# Patient Record
Sex: Female | Born: 1975 | Race: White | Hispanic: No | Marital: Married | Smoking: Former smoker
Health system: Southern US, Community
[De-identification: ages and names within clinical notes are randomized; demographics above are authoritative.]

## PROBLEM LIST (undated history)

## (undated) HISTORY — PX: SHOULDER ARTHROSCOPY: SHX128

## (undated) HISTORY — PX: APPENDECTOMY: SHX54

## (undated) HISTORY — PX: CARDIAC ELECTROPHYSIOLOGY STUDY AND ABLATION: SHX1294

## (undated) HISTORY — PX: KNEE ARTHROPLASTY: SHX992

---

## 2010-10-12 ENCOUNTER — Ambulatory Visit: Payer: Self-pay | Admitting: Family Medicine

## 2010-12-16 ENCOUNTER — Ambulatory Visit: Payer: Self-pay | Admitting: Unknown Physician Specialty

## 2010-12-23 ENCOUNTER — Ambulatory Visit: Payer: Self-pay | Admitting: Unknown Physician Specialty

## 2011-05-17 ENCOUNTER — Ambulatory Visit: Payer: Self-pay | Admitting: Obstetrics and Gynecology

## 2011-05-19 ENCOUNTER — Ambulatory Visit: Payer: Self-pay | Admitting: Obstetrics and Gynecology

## 2011-11-20 ENCOUNTER — Ambulatory Visit: Payer: Self-pay | Admitting: Surgery

## 2011-12-18 ENCOUNTER — Ambulatory Visit: Payer: Self-pay | Admitting: Surgery

## 2011-12-18 HISTORY — PX: BREAST BIOPSY: SHX20

## 2011-12-25 LAB — PATHOLOGY REPORT

## 2012-05-20 ENCOUNTER — Ambulatory Visit: Payer: Self-pay | Admitting: Surgery

## 2013-03-19 ENCOUNTER — Emergency Department: Payer: Self-pay | Admitting: Emergency Medicine

## 2014-08-13 ENCOUNTER — Ambulatory Visit: Payer: Self-pay | Admitting: Obstetrics and Gynecology

## 2017-01-22 ENCOUNTER — Other Ambulatory Visit: Payer: Self-pay | Admitting: Obstetrics & Gynecology

## 2017-01-22 DIAGNOSIS — N6310 Unspecified lump in the right breast, unspecified quadrant: Secondary | ICD-10-CM

## 2017-01-22 DIAGNOSIS — Z1239 Encounter for other screening for malignant neoplasm of breast: Secondary | ICD-10-CM

## 2017-01-26 ENCOUNTER — Ambulatory Visit
Admission: RE | Admit: 2017-01-26 | Discharge: 2017-01-26 | Disposition: A | Discharge status: Home / Self Care | Payer: BLUE CROSS/BLUE SHIELD | Source: Ambulatory Visit | Attending: Obstetrics & Gynecology | Admitting: Obstetrics & Gynecology

## 2017-01-26 ENCOUNTER — Encounter: Payer: Self-pay | Admitting: Radiology

## 2017-01-26 DIAGNOSIS — N6001 Solitary cyst of right breast: Secondary | ICD-10-CM | POA: Insufficient documentation

## 2017-01-26 DIAGNOSIS — N6310 Unspecified lump in the right breast, unspecified quadrant: Secondary | ICD-10-CM

## 2017-01-26 DIAGNOSIS — Z1239 Encounter for other screening for malignant neoplasm of breast: Secondary | ICD-10-CM

## 2017-01-26 DIAGNOSIS — N6320 Unspecified lump in the left breast, unspecified quadrant: Secondary | ICD-10-CM | POA: Insufficient documentation

## 2018-01-16 ENCOUNTER — Other Ambulatory Visit: Payer: Self-pay | Admitting: Orthopedic Surgery

## 2018-01-16 DIAGNOSIS — M2351 Chronic instability of knee, right knee: Secondary | ICD-10-CM

## 2018-01-16 DIAGNOSIS — M1711 Unilateral primary osteoarthritis, right knee: Secondary | ICD-10-CM

## 2018-01-16 DIAGNOSIS — M2391 Unspecified internal derangement of right knee: Secondary | ICD-10-CM

## 2018-01-16 DIAGNOSIS — M25561 Pain in right knee: Secondary | ICD-10-CM

## 2018-01-25 ENCOUNTER — Other Ambulatory Visit: Payer: Self-pay | Admitting: Obstetrics & Gynecology

## 2018-01-25 DIAGNOSIS — Z1231 Encounter for screening mammogram for malignant neoplasm of breast: Secondary | ICD-10-CM

## 2018-01-29 ENCOUNTER — Ambulatory Visit
Admission: RE | Admit: 2018-01-29 | Discharge: 2018-01-29 | Disposition: A | Discharge status: Home / Self Care | Payer: BLUE CROSS/BLUE SHIELD | Source: Ambulatory Visit | Attending: Orthopedic Surgery | Admitting: Orthopedic Surgery

## 2018-01-29 DIAGNOSIS — M1711 Unilateral primary osteoarthritis, right knee: Secondary | ICD-10-CM

## 2018-01-29 DIAGNOSIS — M25561 Pain in right knee: Secondary | ICD-10-CM

## 2018-01-29 DIAGNOSIS — M2351 Chronic instability of knee, right knee: Secondary | ICD-10-CM

## 2018-01-29 DIAGNOSIS — M2391 Unspecified internal derangement of right knee: Secondary | ICD-10-CM

## 2018-02-04 ENCOUNTER — Ambulatory Visit
Admission: RE | Admit: 2018-02-04 | Discharge: 2018-02-04 | Disposition: A | Discharge status: Home / Self Care | Payer: BC Managed Care – PPO | Source: Ambulatory Visit | Attending: Obstetrics & Gynecology | Admitting: Obstetrics & Gynecology

## 2018-02-04 DIAGNOSIS — Z1231 Encounter for screening mammogram for malignant neoplasm of breast: Secondary | ICD-10-CM | POA: Diagnosis present

## 2018-03-31 IMAGING — US US BREAST*R* LIMITED INC AXILLA
1 series · 12 of 12 positions shown · non-contrast
Comparison: Previous exam(s).

CLINICAL DATA: Focal pain in the nipple area of the right breast
recently, resolved today. She also feels a mass in the upper-outer
quadrant of the right breast.

EXAM:
2D DIGITAL DIAGNOSTIC BILATERAL MAMMOGRAM WITH CAD AND ADJUNCT TOMO
ULTRASOUND RIGHT BREAST

[Series 1: us breast*right* limited inc axilla · 0.09mm/px · 12 of 12 slices shown]
[im 1/12]
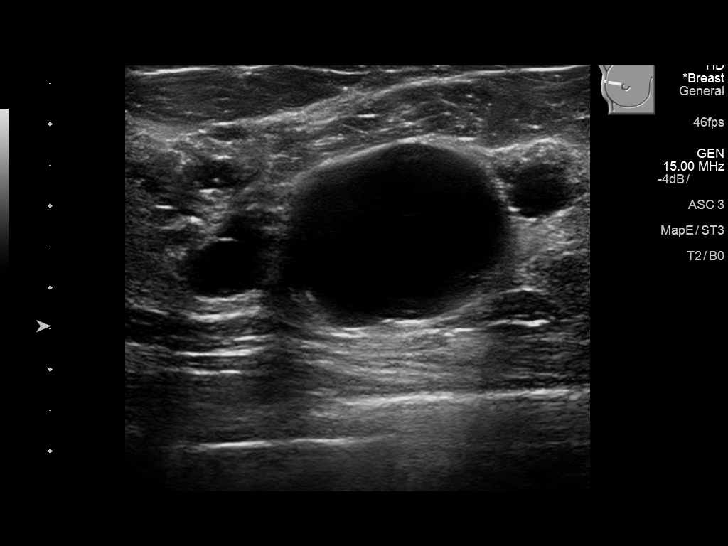
[im 2/12]
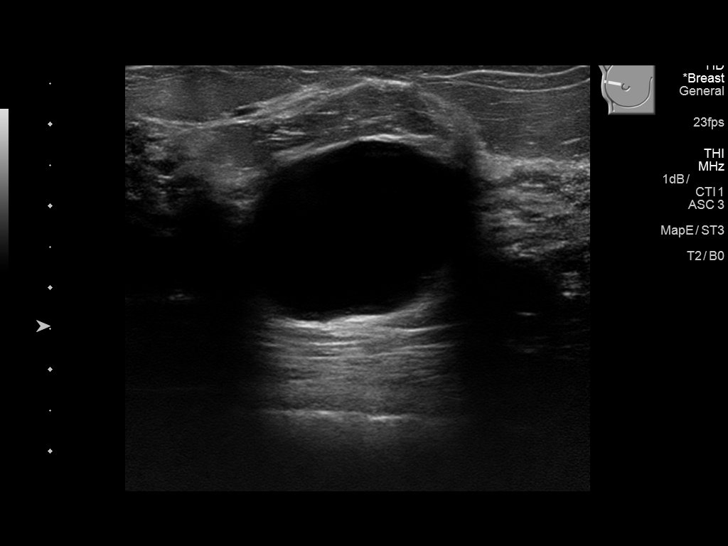
[im 3/12]
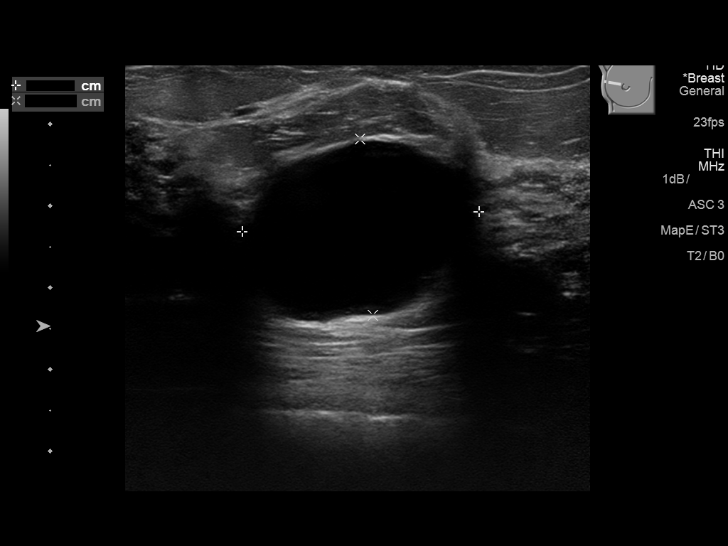
[im 4/12]
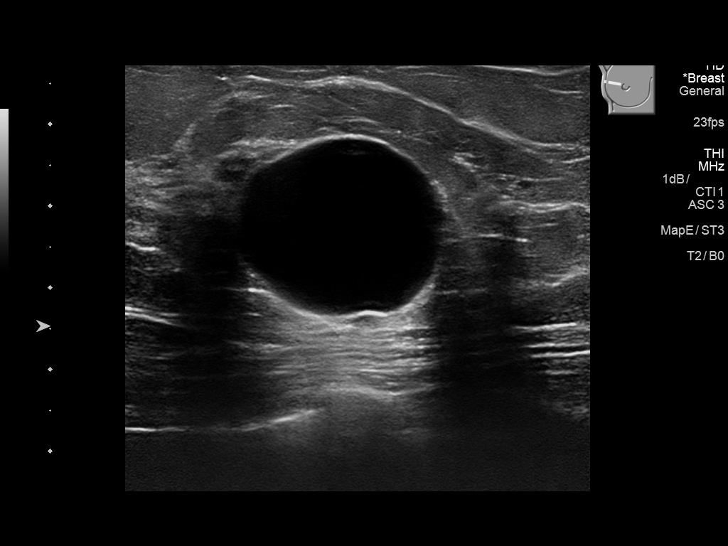
[im 5/12]
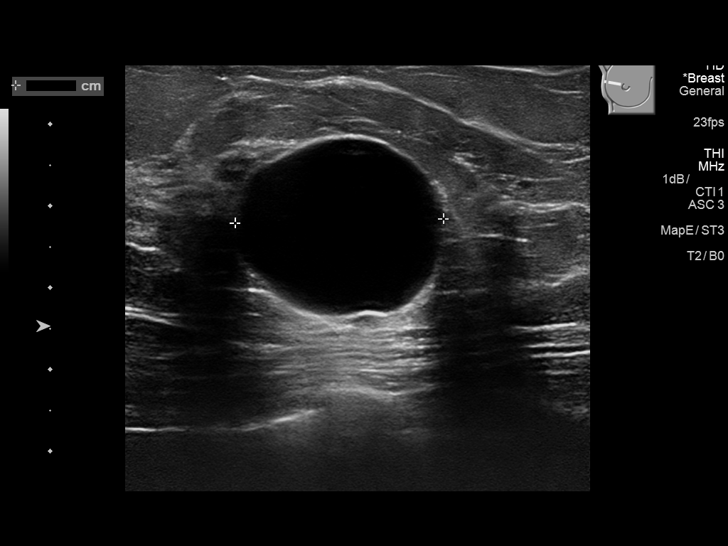
[im 6/12]
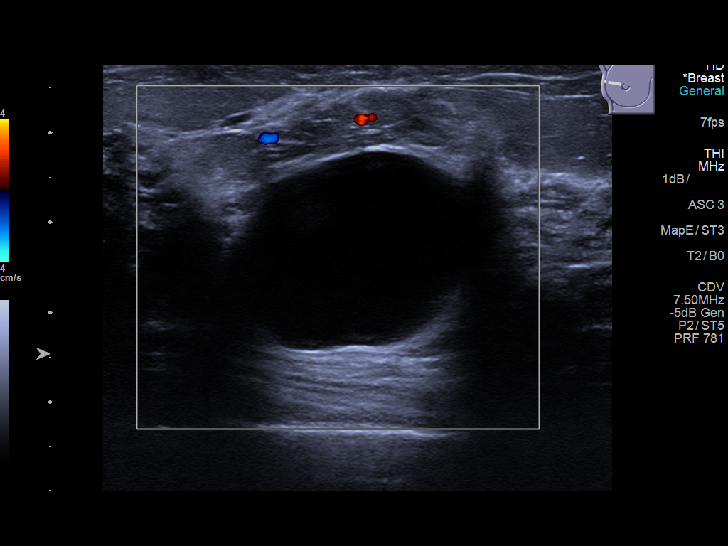
[im 7/12]
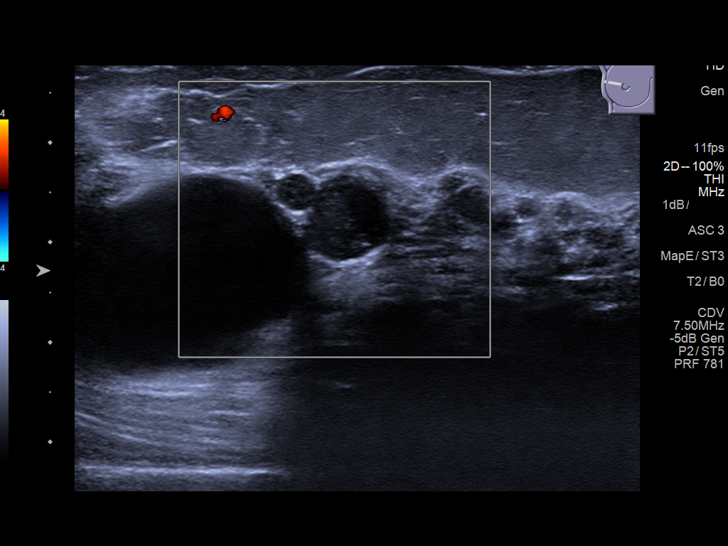
[im 8/12]
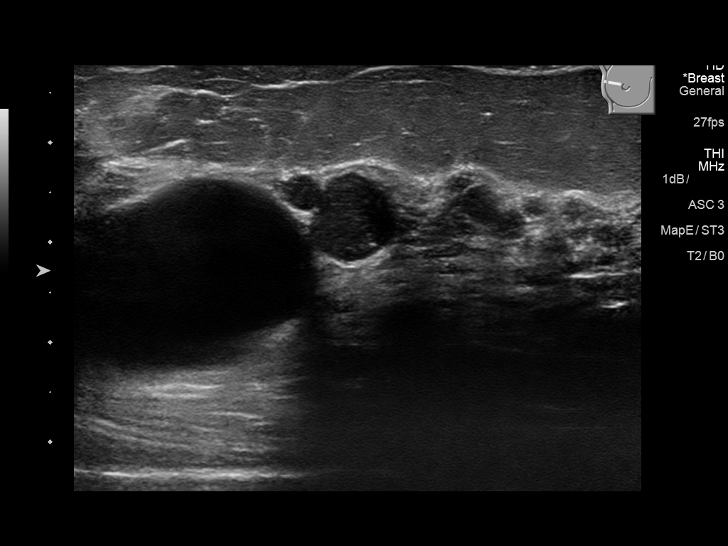
[im 9/12]
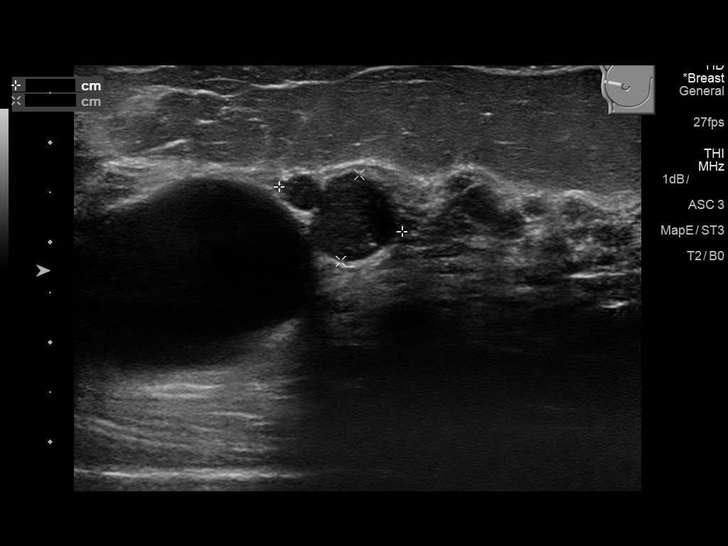
[im 10/12]
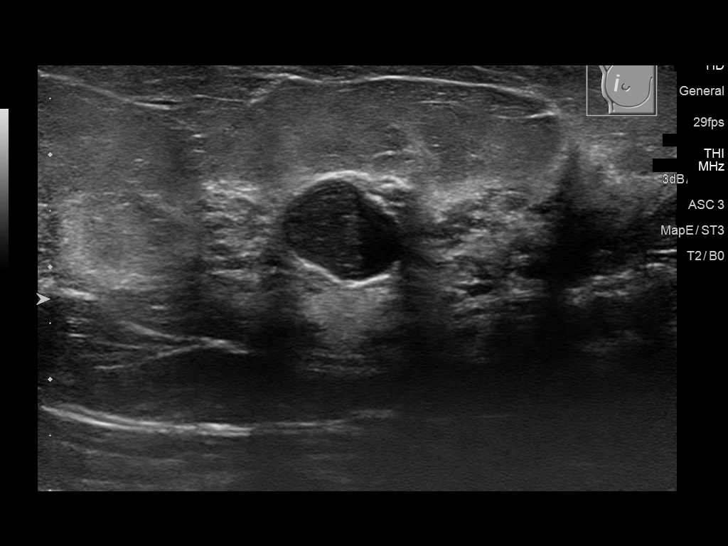
[im 11/12]
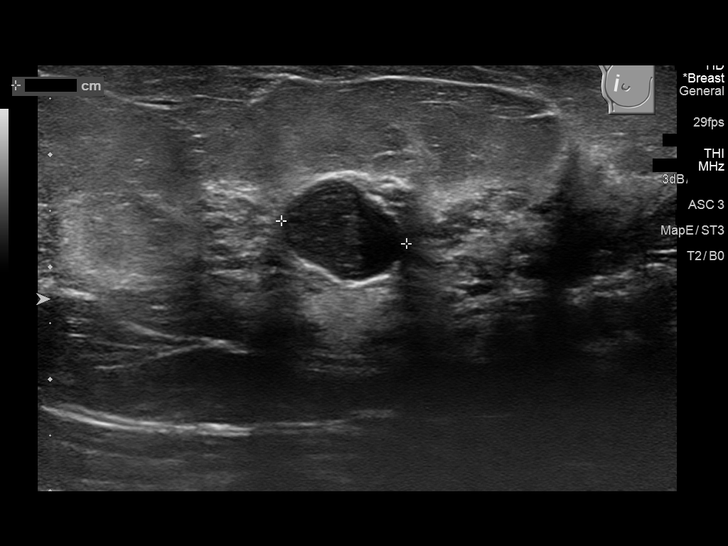
[im 12/12]
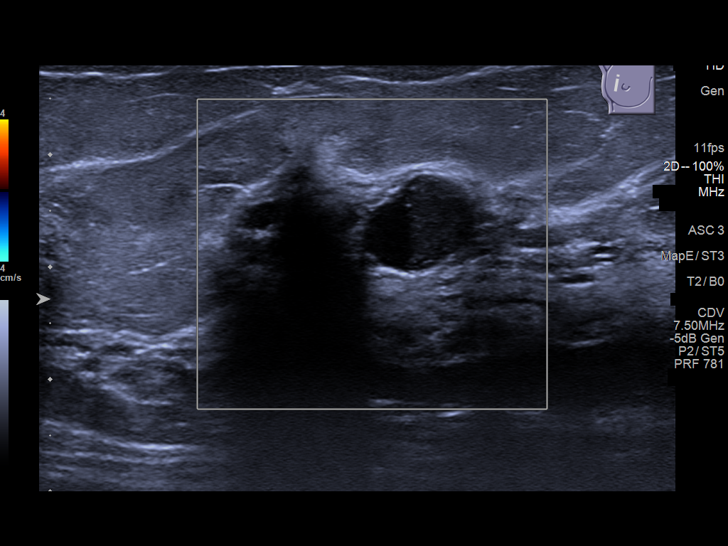

[12 of 12 positions shown; findings below may reference images not displayed]

ACR Breast Density Category d: The breast tissue is extremely dense,
which lowers the sensitivity of mammography.
FINDINGS: 2D and 3D tomographic images of the right breast were obtained.
These demonstrate multiple rounded and oval, circumscribed masses in
both breasts. One of these is at the location of the mass felt by
the patient on the right, marked with a metallic marker. There are
no findings suspicious for malignancy in either breast.

Mammographic images were processed with CAD.

On physical exam, the patient has an approximately 2 cm rounded,
faintly palpable mass in the 9:30 o'clock position of the right
breast, 5 cm from the nipple at the location of patient concern.

Targeted ultrasound is performed, showing a 2.9 cm simple cyst in
the 9:30 o'clock position of the right breast, 5 cm from the nipple,
corresponding to the palpable mass. There are multiple additional
smaller cysts in that region of the breast. One of these is bilobed
and has internal debris with no internal blood flow with color
Doppler. That cyst measures 1.3 cm in maximum diameter.
IMPRESSION: Benign right breast cysts and multiple similar-appearing benign left
breast masses, also most likely representing cysts. No evidence of
malignancy.

RECOMMENDATION:
Bilateral screening mammogram in 1 year.

I have discussed the findings and recommendations with the patient.
Results were also provided in writing at the conclusion of the
visit. If applicable, a reminder letter will be sent to the patient
regarding the next appointment.

BI-RADS CATEGORY  2: Benign.

## 2019-02-14 ENCOUNTER — Other Ambulatory Visit: Payer: Self-pay | Admitting: Obstetrics & Gynecology

## 2019-02-14 DIAGNOSIS — Z1231 Encounter for screening mammogram for malignant neoplasm of breast: Secondary | ICD-10-CM

## 2019-02-25 ENCOUNTER — Ambulatory Visit
Admission: RE | Admit: 2019-02-25 | Discharge: 2019-02-25 | Disposition: A | Discharge status: Home / Self Care | Payer: BC Managed Care – PPO | Source: Ambulatory Visit | Attending: Obstetrics & Gynecology | Admitting: Obstetrics & Gynecology

## 2019-02-25 ENCOUNTER — Other Ambulatory Visit: Payer: Self-pay

## 2019-02-25 DIAGNOSIS — Z1231 Encounter for screening mammogram for malignant neoplasm of breast: Secondary | ICD-10-CM | POA: Insufficient documentation

## 2019-05-22 ENCOUNTER — Encounter
Admission: RE | Admit: 2019-05-22 | Discharge: 2019-05-22 | Disposition: A | Discharge status: Home / Self Care | Payer: BC Managed Care – PPO | Source: Ambulatory Visit | Attending: Obstetrics & Gynecology | Admitting: Obstetrics & Gynecology

## 2019-05-22 ENCOUNTER — Other Ambulatory Visit: Payer: Self-pay

## 2019-05-22 NOTE — Patient Instructions (Signed)
Your procedure is scheduled on: Friday 05/30/19  Report to DAY SURGERY DEPARTMENT LOCATED ON 2ND FLOOR MEDICAL MALL ENTRANCE. To find out your arrival time please call [phone] between 1PM - 3PM on Thursday 05/29/19.   Remember: Instructions that are not followed completely may result in serious medical risk, up to and including death, or upon the discretion of your surgeon and anesthesiologist your surgery may need to be rescheduled.      _X__ 1. Do not eat food after midnight the night before your procedure.                 No gum chewing or hard candies. You may drink clear liquids up to 2 hours                 before you are scheduled to arrive for your surgery- DO NOT drink clear                 liquids within 2 hours of the start of your surgery.                 Clear Liquids include:  water, apple juice without pulp, clear carbohydrate                 drink such as Clearfast or Gatorade, Black Coffee or Tea (Do not add                 milk or creamer to coffee or tea).  **Dr. Elesa MassedWard would like for you to drink the Ensure Pre-Surgery Drink on the morning of your surgery. Please finish this drink 2 hours before your arrival time.   __X__2.  On the morning of surgery brush your teeth with toothpaste and water, you may rinse your mouth with mouthwash if you wish.  Do not swallow any toothpaste or mouthwash.      _X__ 3.  No Alcohol for 24 hours before or after surgery.    _X__ 4.  Do Not Smoke or use e-cigarettes For 24 Hours Prior to Your Surgery.                 Do not use any chewable tobacco products for at least 6 hours prior to                 Surgery.    __X__5.  Notify your doctor if there is any change in your medical condition      (cold, fever, infections).      Do not wear jewelry, make-up, hairpins, clips or nail polish. Do not wear lotions, powders, or perfumes.  Do not shave 48 hours prior to surgery. Men may shave face and neck. Do not bring valuables to  the hospital.    [facility] is not responsible for any belongings or valuables.   Contacts, dentures/partials or body piercings may not be worn into surgery. Bring a case for your contacts, glasses or hearing aids, a denture cup will be supplied.   Patients discharged the day of surgery will not be allowed to drive home.    Please read over the following fact sheets that you were given:   MRSA Information   __X__ Take these medicines the morning of surgery with A SIP OF WATER:     1. acetaminophen (TYLENOL) 500 MG tablet if needed  2. traMADol (ULTRAM) 50 MG tablet if needed     __X__ Use CHG Soap as directed   _ X___ Use inhalers on  the day of surgery. Also bring the inhaler with you to the hospital on the morning of surgery.   __X__ Stop Anti-inflammatories 7 days before surgery such as Advil, Ibuprofen, Motrin, BC or Goodies Powder, Naprosyn, Naproxen, Aleve, Aspirin, Meloxicam. May take Tylenol or Tramadol if needed for pain or discomfort.    __X__ Stop taking Glucosamine-Chondroitin (GLUCOSAMINE CHONDR COMPLEX PO) today.

## 2019-05-23 IMAGING — MG MM DIGITAL SCREENING BILAT W/ TOMO W/ CAD
8 series · 8 of 24 positions shown · non-contrast
Comparison: Previous exam(s).

CLINICAL DATA: Screening.

EXAM:
DIGITAL SCREENING BILATERAL MAMMOGRAM WITH TOMO AND CAD

[R MLO synth-2D]
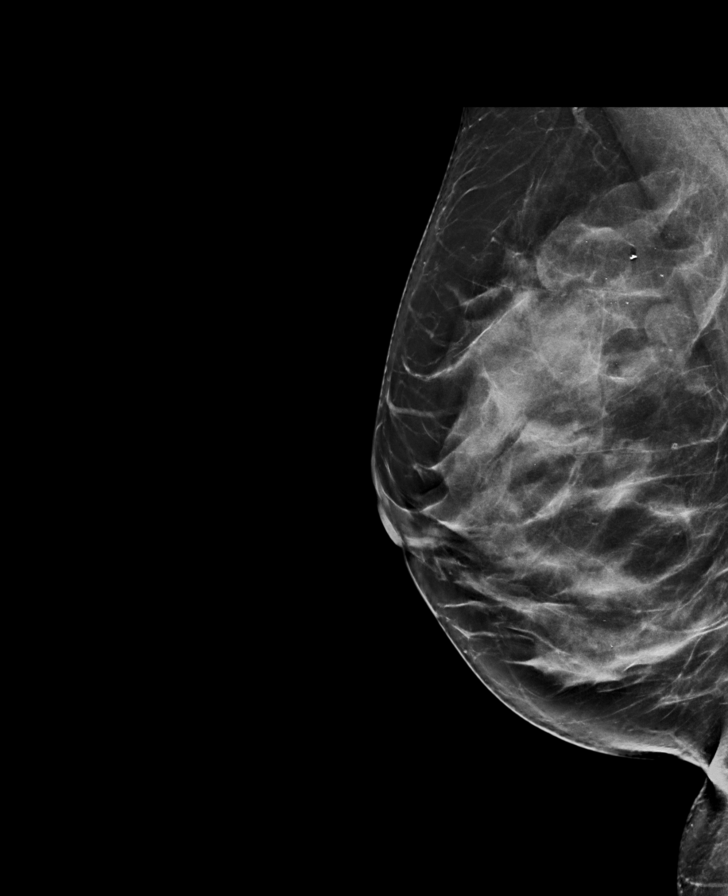

[L CC synth-2D]
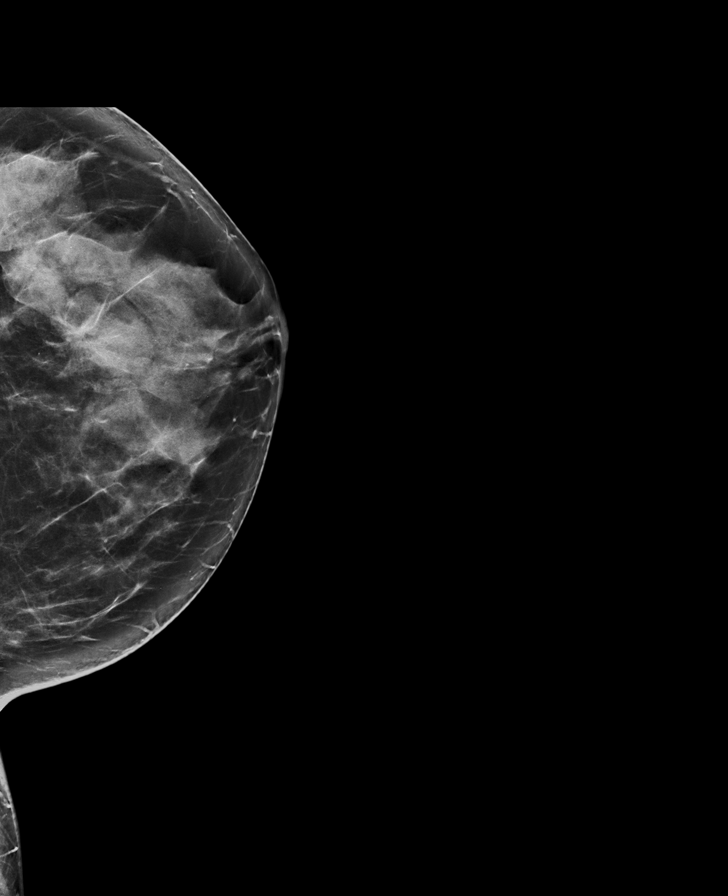

[R CC synth-2D]
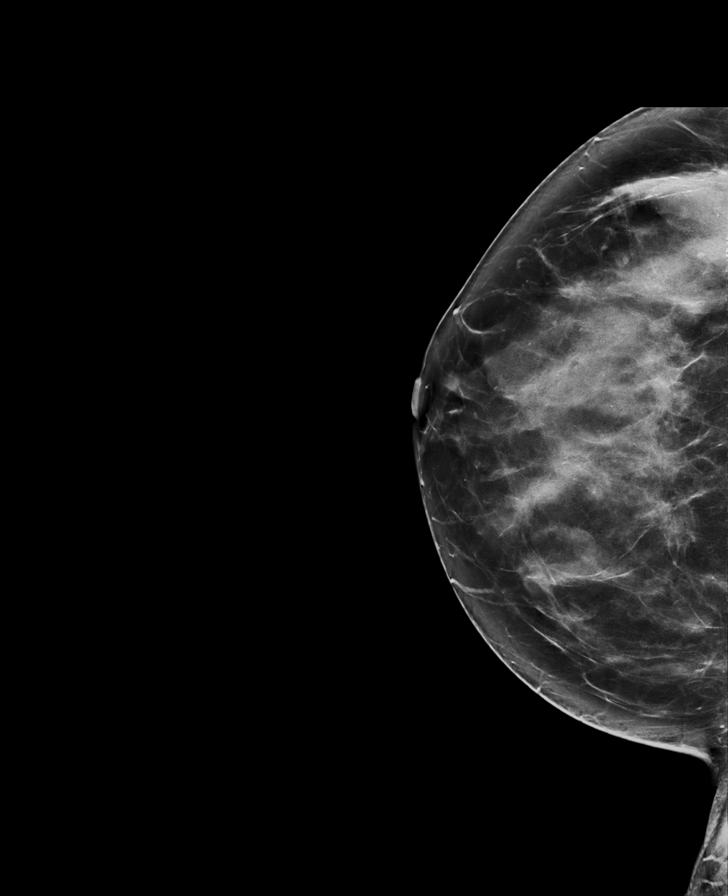

[L MLO synth-2D]
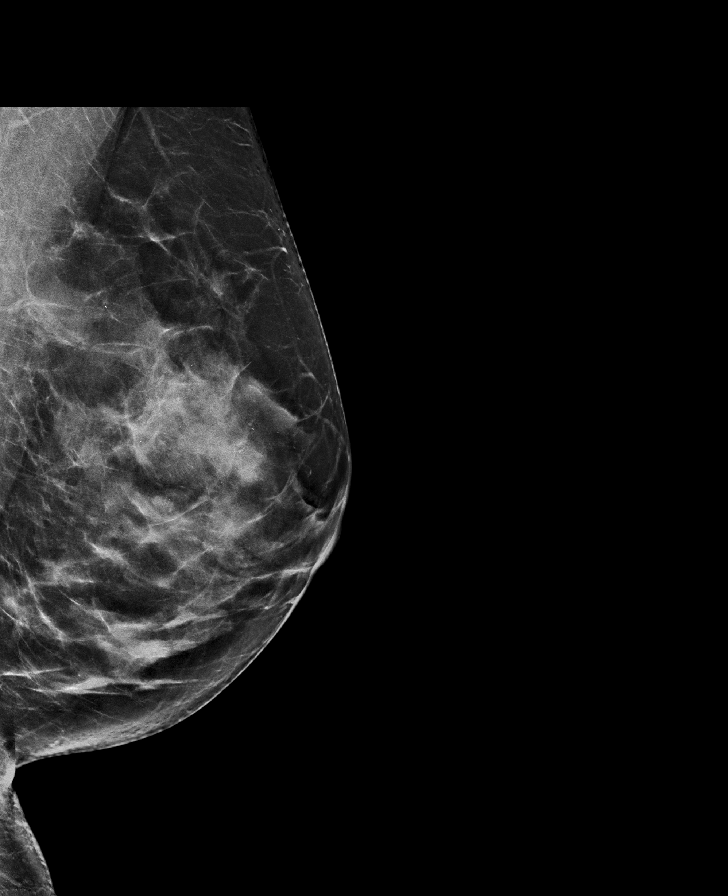

[R MLO tomo · tomo slice 41/82.0]
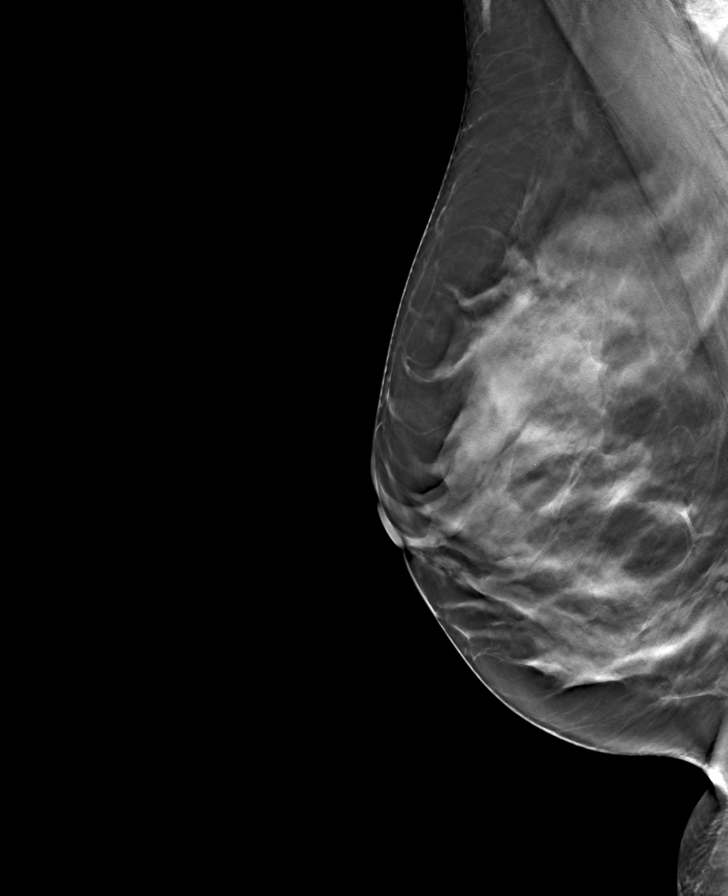

[L MLO tomo · tomo slice 39/76.0]
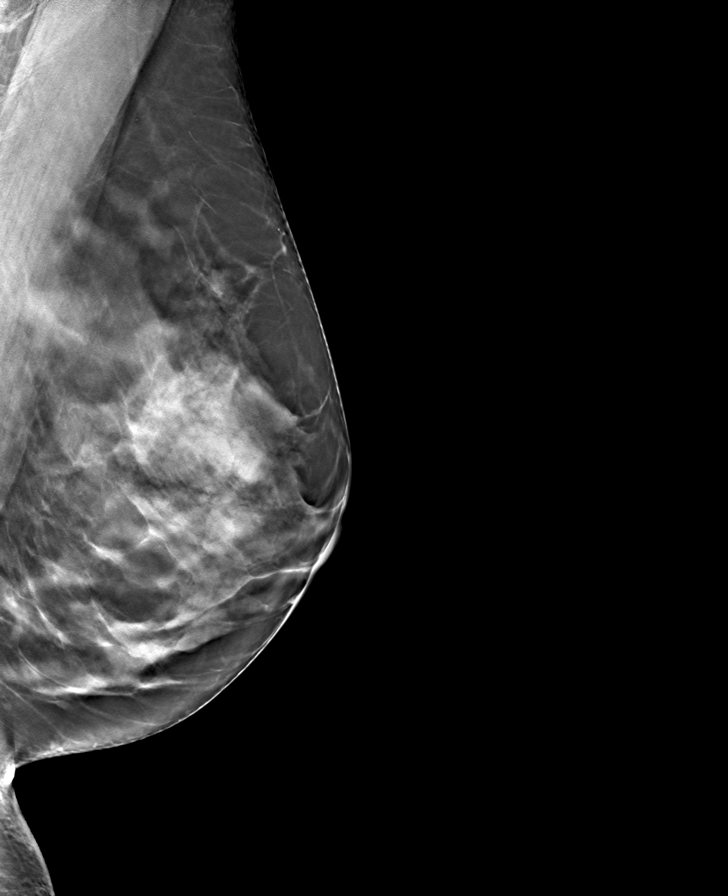

[R CC tomo · tomo slice 43/86.0]
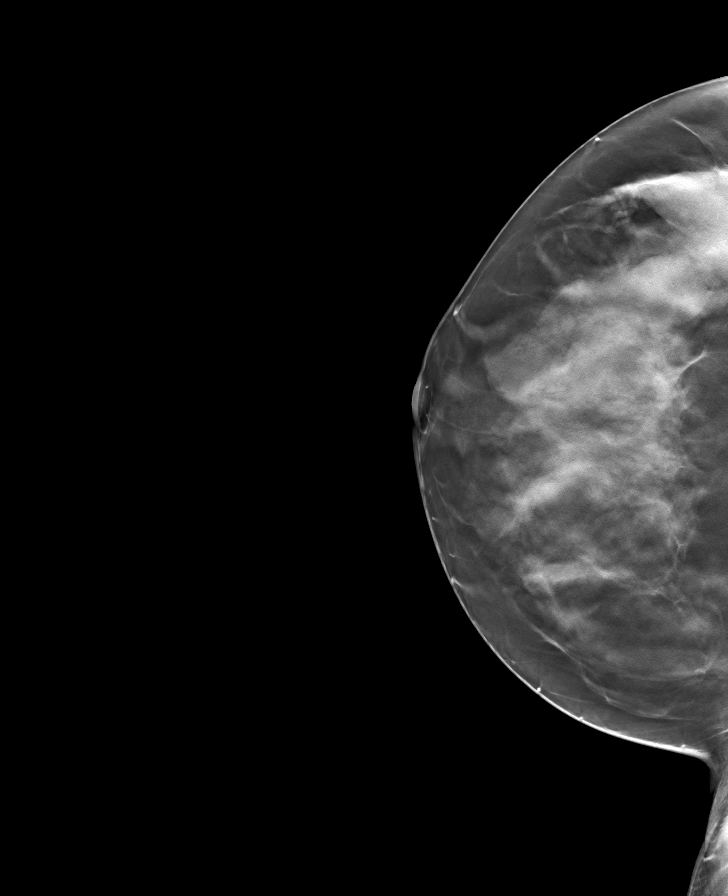

[L CC tomo · tomo slice 40/79.0]
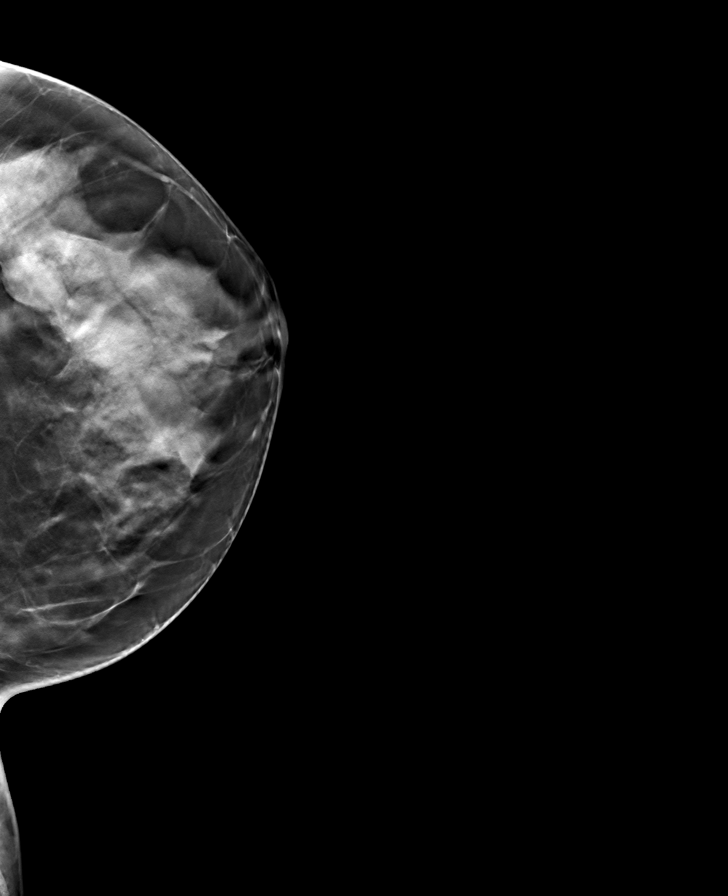

[8 of 24 positions shown; findings below may reference images not displayed]

ACR Breast Density Category c: The breast tissue is heterogeneously
dense, which may obscure small masses.
FINDINGS: There are no findings suspicious for malignancy. Images were
processed with CAD.
IMPRESSION: No mammographic evidence of malignancy. A result letter of this
screening mammogram will be mailed directly to the patient.

RECOMMENDATION:
Screening mammogram in one year. (Code:[id number])

BI-RADS CATEGORY  1: Negative.

## 2019-05-27 ENCOUNTER — Other Ambulatory Visit: Payer: Self-pay

## 2019-05-27 ENCOUNTER — Other Ambulatory Visit
Admission: RE | Admit: 2019-05-27 | Discharge: 2019-05-27 | Disposition: A | Discharge status: Home / Self Care | Payer: BC Managed Care – PPO | Source: Ambulatory Visit | Attending: Obstetrics & Gynecology | Admitting: Obstetrics & Gynecology

## 2019-05-27 ENCOUNTER — Other Ambulatory Visit: Payer: BC Managed Care – PPO

## 2019-05-27 DIAGNOSIS — Z01812 Encounter for preprocedural laboratory examination: Secondary | ICD-10-CM | POA: Insufficient documentation

## 2019-05-27 DIAGNOSIS — Z20828 Contact with and (suspected) exposure to other viral communicable diseases: Secondary | ICD-10-CM | POA: Diagnosis not present

## 2019-05-27 LAB — BASIC METABOLIC PANEL
Anion gap: 10 (ref 5–15)
BUN: 18 mg/dL (ref 6–20)
CO2: 24 mmol/L (ref 22–32)
Calcium: 9 mg/dL (ref 8.9–10.3)
Chloride: 106 mmol/L (ref 98–111)
Creatinine, Ser: 0.61 mg/dL (ref 0.44–1.00)
GFR calc Af Amer: >60 mL/min (ref >60)
GFR calc non Af Amer: >60 mL/min (ref >60)
Glucose, Bld: 90 mg/dL (ref 70–99)
Potassium: 3.9 mmol/L (ref 3.5–5.1)
Sodium: 140 mmol/L (ref 135–145)

## 2019-05-27 LAB — SARS CORONAVIRUS 2 (TAT 6-24 HRS): SARS Coronavirus 2: NEGATIVE

## 2019-05-27 LAB — CBC
HCT: 45.4 % (ref 36.0–46.0)
Hemoglobin: 15.5 g/dL — ABNORMAL HIGH (ref 12.0–15.0)
MCH: 30.8 pg (ref 26.0–34.0)
MCHC: 34.1 g/dL (ref 30.0–36.0)
MCV: 90.1 fL (ref 80.0–100.0)
Platelets: 198 10*3/uL (ref 150–400)
RBC: 5.04 MIL/uL (ref 3.87–5.11)
RDW: 12.2 % (ref 11.5–15.5)
WBC: 4.3 10*3/uL (ref 4.0–10.5)
nRBC: 0 % (ref 0.0–0.2)

## 2019-05-27 LAB — TYPE AND SCREEN
ABO/RH(D): A POS
Antibody Screen: NEGATIVE

## 2019-05-30 ENCOUNTER — Other Ambulatory Visit: Payer: Self-pay

## 2019-05-30 ENCOUNTER — Ambulatory Visit: Payer: BC Managed Care – PPO | Admitting: Anesthesiology

## 2019-05-30 ENCOUNTER — Encounter
Admission: RE | Disposition: A | Discharge status: Home / Self Care | Payer: Self-pay | Source: Home / Self Care | Attending: Obstetrics & Gynecology

## 2019-05-30 ENCOUNTER — Ambulatory Visit
Admission: RE | Admit: 2019-05-30 | Discharge: 2019-05-30 | Disposition: A | Discharge status: Home / Self Care | Payer: BC Managed Care – PPO | Attending: Obstetrics & Gynecology | Admitting: Obstetrics & Gynecology

## 2019-05-30 DIAGNOSIS — Y768 Miscellaneous obstetric and gynecological devices associated with adverse incidents, not elsewhere classified: Secondary | ICD-10-CM | POA: Insufficient documentation

## 2019-05-30 DIAGNOSIS — T8339XA Other mechanical complication of intrauterine contraceptive device, initial encounter: Secondary | ICD-10-CM | POA: Diagnosis not present

## 2019-05-30 DIAGNOSIS — Z96652 Presence of left artificial knee joint: Secondary | ICD-10-CM | POA: Diagnosis not present

## 2019-05-30 DIAGNOSIS — T839XXA Unspecified complication of genitourinary prosthetic device, implant and graft, initial encounter: Secondary | ICD-10-CM

## 2019-05-30 DIAGNOSIS — Z791 Long term (current) use of non-steroidal anti-inflammatories (NSAID): Secondary | ICD-10-CM | POA: Diagnosis not present

## 2019-05-30 DIAGNOSIS — Z30430 Encounter for insertion of intrauterine contraceptive device: Secondary | ICD-10-CM | POA: Diagnosis not present

## 2019-05-30 HISTORY — PX: IUD REMOVAL: SHX5392

## 2019-05-30 HISTORY — PX: INTRAUTERINE DEVICE (IUD) INSERTION: SHX5877

## 2019-05-30 HISTORY — PX: HYSTEROSCOPY: SHX211

## 2019-05-30 LAB — ABO/RH: ABO/RH(D): A POS

## 2019-05-30 LAB — POCT PREGNANCY, URINE: Preg Test, Ur: NEGATIVE

## 2019-05-30 SURGERY — HYSTEROSCOPY
Anesthesia: General

## 2019-05-30 MED ORDER — FENTANYL CITRATE (PF) 100 MCG/2ML IJ SOLN
INTRAMUSCULAR | Status: AC
  Filled 2019-05-30: qty 2

## 2019-05-30 MED ORDER — KETOROLAC TROMETHAMINE 30 MG/ML IJ SOLN
INTRAMUSCULAR | Status: DC | PRN
  Administered 2019-05-30: 30 mg via INTRAVENOUS

## 2019-05-30 MED ORDER — KETOROLAC TROMETHAMINE 30 MG/ML IJ SOLN
INTRAMUSCULAR | Status: AC
  Filled 2019-05-30: qty 1

## 2019-05-30 MED ORDER — DEXMEDETOMIDINE HCL IN NACL 200 MCG/50ML IV SOLN
INTRAVENOUS | Status: DC | PRN
  Administered 2019-05-30: 8 ug via INTRAVENOUS

## 2019-05-30 MED ORDER — OXYCODONE HCL 5 MG/5ML PO SOLN: 5.0000 mg | Freq: Once | ORAL | Status: DC | PRN

## 2019-05-30 MED ORDER — SILVER NITRATE-POT NITRATE 75-25 % EX MISC
CUTANEOUS | Status: DC | PRN
  Administered 2019-05-30: 2

## 2019-05-30 MED ORDER — DEXAMETHASONE SODIUM PHOSPHATE 10 MG/ML IJ SOLN
INTRAMUSCULAR | Status: AC
  Filled 2019-05-30: qty 1

## 2019-05-30 MED ORDER — FAMOTIDINE 20 MG PO TABS
20.0000 mg | ORAL_TABLET | Freq: Once | ORAL | Status: AC
  Administered 2019-05-30: 11:00:00 20 mg via ORAL

## 2019-05-30 MED ORDER — MIDAZOLAM HCL 2 MG/2ML IJ SOLN
INTRAMUSCULAR | Status: DC | PRN
  Administered 2019-05-30: 2 mg via INTRAVENOUS

## 2019-05-30 MED ORDER — LIDOCAINE HCL (PF) 2 % IJ SOLN
INTRAMUSCULAR | Status: AC
  Filled 2019-05-30: qty 10

## 2019-05-30 MED ORDER — MIDAZOLAM HCL 2 MG/2ML IJ SOLN
INTRAMUSCULAR | Status: AC
  Filled 2019-05-30: qty 2

## 2019-05-30 MED ORDER — LIDOCAINE HCL (CARDIAC) PF 100 MG/5ML IV SOSY
PREFILLED_SYRINGE | INTRAVENOUS | Status: DC | PRN
  Administered 2019-05-30: 80 mg via INTRAVENOUS

## 2019-05-30 MED ORDER — GLYCOPYRROLATE 0.2 MG/ML IJ SOLN
INTRAMUSCULAR | Status: DC | PRN
  Administered 2019-05-30: 0.2 mg via INTRAVENOUS

## 2019-05-30 MED ORDER — ONDANSETRON HCL 4 MG/2ML IJ SOLN
INTRAMUSCULAR | Status: DC | PRN
  Administered 2019-05-30: 4 mg via INTRAVENOUS

## 2019-05-30 MED ORDER — EPHEDRINE SULFATE 50 MG/ML IJ SOLN
INTRAMUSCULAR | Status: AC
  Filled 2019-05-30: qty 1

## 2019-05-30 MED ORDER — LIDOCAINE HCL (PF) 1 % IJ SOLN
INTRAMUSCULAR | Status: AC
  Filled 2019-05-30: qty 30

## 2019-05-30 MED ORDER — EPHEDRINE SULFATE 50 MG/ML IJ SOLN
INTRAMUSCULAR | Status: DC | PRN
  Administered 2019-05-30 (×2): 5 mg via INTRAVENOUS

## 2019-05-30 MED ORDER — LACTATED RINGERS IV SOLN
INTRAVENOUS | Status: DC
  Administered 2019-05-30: 11:00:00 via INTRAVENOUS

## 2019-05-30 MED ORDER — PROPOFOL 10 MG/ML IV BOLUS
INTRAVENOUS | Status: DC | PRN
  Administered 2019-05-30: 150 mg via INTRAVENOUS
  Administered 2019-05-30: 50 mg via INTRAVENOUS

## 2019-05-30 MED ORDER — FENTANYL CITRATE (PF) 100 MCG/2ML IJ SOLN
INTRAMUSCULAR | Status: AC
  Administered 2019-05-30: 16:00:00 25 ug via INTRAVENOUS
  Filled 2019-05-30: qty 2

## 2019-05-30 MED ORDER — FENTANYL CITRATE (PF) 100 MCG/2ML IJ SOLN
INTRAMUSCULAR | Status: DC | PRN
  Administered 2019-05-30 (×2): 25 ug via INTRAVENOUS
  Administered 2019-05-30: 50 ug via INTRAVENOUS

## 2019-05-30 MED ORDER — FENTANYL CITRATE (PF) 100 MCG/2ML IJ SOLN
25.0000 ug | INTRAMUSCULAR | Status: DC | PRN
  Administered 2019-05-30 (×2): 25 ug via INTRAVENOUS

## 2019-05-30 MED ORDER — FAMOTIDINE 20 MG PO TABS
ORAL_TABLET | ORAL | Status: AC
  Administered 2019-05-30: 20 mg via ORAL
  Filled 2019-05-30: qty 1

## 2019-05-30 MED ORDER — OXYCODONE HCL 5 MG PO TABS: 5.0000 mg | ORAL_TABLET | Freq: Once | ORAL | Status: DC | PRN

## 2019-05-30 MED ORDER — DEXAMETHASONE SODIUM PHOSPHATE 10 MG/ML IJ SOLN
INTRAMUSCULAR | Status: DC | PRN
  Administered 2019-05-30: 10 mg via INTRAVENOUS

## 2019-05-30 MED ORDER — ONDANSETRON HCL 4 MG/2ML IJ SOLN
INTRAMUSCULAR | Status: AC
  Filled 2019-05-30: qty 2

## 2019-05-30 MED ORDER — PROPOFOL 10 MG/ML IV BOLUS
INTRAVENOUS | Status: AC
  Filled 2019-05-30: qty 20

## 2019-05-30 SURGICAL SUPPLY — 26 items
CATH ROBINSON RED A/P 16FR (CATHETERS) ×3 IMPLANT
CORD URO TURP 10FT (MISCELLANEOUS) IMPLANT
COVER WAND RF STERILE (DRAPES) ×1 IMPLANT
ELECT REM PT RETURN 9FT ADLT (ELECTROSURGICAL)
ELECT RESECT POWERBALL 24F (MISCELLANEOUS) IMPLANT
ELECTRODE REM PT RTRN 9FT ADLT (ELECTROSURGICAL) ×1 IMPLANT
GLOVE PI ORTHOPRO 6.5 (GLOVE) ×12
GLOVE PI ORTHOPRO STRL 6.5 (GLOVE) ×1 IMPLANT
GLOVE SURG SYN 6.5 ES PF (GLOVE) ×18 IMPLANT
GLOVE SURG SYN 6.5 PF PI (GLOVE) ×1 IMPLANT
GOWN STRL REUS W/ TWL LRG LVL3 (GOWN DISPOSABLE) ×2 IMPLANT
GOWN STRL REUS W/TWL LRG LVL3 (GOWN DISPOSABLE) ×12
KIT PROCEDURE FLUENT (KITS) IMPLANT
KIT TURNOVER CYSTO (KITS) ×3 IMPLANT
NDL SPNL 22GX3.5 QUINCKE BK (NEEDLE) IMPLANT
NEEDLE SPNL 22GX3.5 QUINCKE BK (NEEDLE) IMPLANT
PACK DNC HYST (MISCELLANEOUS) ×3 IMPLANT
PAD OB MATERNITY 4.3X12.25 (PERSONAL CARE ITEMS) ×3 IMPLANT
PAD PREP 24X41 OB/GYN DISP (PERSONAL CARE ITEMS) ×3 IMPLANT
SOL .9 NS 3000ML IRR  AL (IV SOLUTION) ×2
SOL .9 NS 3000ML IRR UROMATIC (IV SOLUTION) ×1 IMPLANT
SYR 10ML LL (SYRINGE) ×3 IMPLANT
TOWEL OR 17X26 4PK STRL BLUE (TOWEL DISPOSABLE) ×3 IMPLANT
TUBING CONNECTING 10 (TUBING) ×2 IMPLANT
TUBING CONNECTING 10' (TUBING) ×1
intrauterine device ×2 IMPLANT

## 2019-05-30 NOTE — Anesthesia Postprocedure Evaluation (Signed)
Anesthesia Post Note  Patient: Jean Barrett  Procedure(s) Performed: HYSTEROSCOPY (N/A ) INTRAUTERINE DEVICE (IUD) INSERTION Mirena (N/A ) INTRAUTERINE DEVICE (IUD) REMOVAL Mirena (N/A )  Patient location during evaluation: PACU Anesthesia Type: General Level of consciousness: awake and alert Pain management: pain level controlled Vital Signs Assessment: post-procedure vital signs reviewed and stable Respiratory status: spontaneous breathing, nonlabored ventilation, respiratory function stable and patient connected to nasal cannula oxygen Cardiovascular status: blood pressure returned to baseline and stable Postop Assessment: no apparent nausea or vomiting Anesthetic complications: no     Last Vitals:  Vitals:   05/30/19 1621 05/30/19 1729  BP: 119/75 119/75  Pulse: 65 72  Resp: 14 16  Temp:    SpO2: 98% 100%    Last Pain:  Vitals:   05/30/19 1729  TempSrc:   PainSc: 2                  Precious Haws Betzy Barbier

## 2019-05-30 NOTE — Anesthesia Preprocedure Evaluation (Addendum)
Anesthesia Evaluation  Patient identified by MRN, date of birth, ID band Patient awake    Reviewed: Allergy & Precautions, H&P , NPO status , Patient's Chart, lab work & pertinent test results  Airway Mallampati: I  TM Distance: >3 FB Neck ROM: full    Dental  (+) Teeth Intact   Pulmonary neg shortness of breath, neg COPD, neg recent URI, Current Smoker,           Cardiovascular (-) hypertension(-) angina(-) Past MI and (-) Cardiac Stents negative cardio ROS  (-) dysrhythmias      Neuro/Psych negative neurological ROS  negative psych ROS   GI/Hepatic negative GI ROS, Neg liver ROS,   Endo/Other  negative endocrine ROS  Renal/GU      Musculoskeletal   Abdominal   Peds  Hematology negative hematology ROS (+)   Anesthesia Other Findings No past medical history on file.  Past Surgical History: No date: APPENDECTOMY 12/18/2011: BREAST BIOPSY; Right     Comment:  Stereotactic biopsy / clip- negative No date: CARDIAC ELECTROPHYSIOLOGY STUDY AND ABLATION No date: KNEE ARTHROPLASTY; Left No date: SHOULDER ARTHROSCOPY; Right     Reproductive/Obstetrics negative OB ROS                           Anesthesia Physical Anesthesia Plan  ASA: II  Anesthesia Plan: General LMA   Post-op Pain Management:    Induction:   PONV Risk Score and Plan: Ondansetron, Dexamethasone, Midazolam and Treatment may vary due to age or medical condition  Airway Management Planned:   Additional Equipment:   Intra-op Plan:   Post-operative Plan:   Informed Consent: I have reviewed the patients History and Physical, chart, labs and discussed the procedure including the risks, benefits and alternatives for the proposed anesthesia with the patient or authorized representative who has indicated his/her understanding and acceptance.     Dental Advisory Given  Plan Discussed with: Anesthesiologist and  CRNA  Anesthesia Plan Comments:         Anesthesia Quick Evaluation

## 2019-05-30 NOTE — Transfer of Care (Signed)
Immediate Anesthesia Transfer of Care Note  Patient: Jean Barrett  Procedure(s) Performed: HYSTEROSCOPY (N/A ) INTRAUTERINE DEVICE (IUD) INSERTION Mirena (N/A ) INTRAUTERINE DEVICE (IUD) REMOVAL Mirena (N/A )  Patient Location: PACU  Anesthesia Type:General  Level of Consciousness: awake and drowsy  Airway & Oxygen Therapy: Patient Spontanous Breathing and Patient connected to nasal cannula oxygen  Post-op Assessment: Report given to RN and Post -op Vital signs reviewed and stable  Post vital signs: stable  Last Vitals:  Vitals Value Taken Time  BP 124/78 05/30/19 1522  Temp    Pulse 77 05/30/19 1528  Resp 14 05/30/19 1528  SpO2 100 % 05/30/19 1528  Vitals shown include unvalidated device data.  Last Pain:  Vitals:   05/30/19 1040  TempSrc: Temporal  PainSc: 0-No pain         Complications: No apparent anesthesia complications

## 2019-05-30 NOTE — Discharge Instructions (Signed)
Hysteroscopy, Care After °This sheet gives you information about how to care for yourself after your procedure. Your health care provider may also give you more specific instructions. If you have problems or questions, contact your health care provider. °What can I expect after the procedure? °After the procedure, it is common to have: °· Cramping. °· Bleeding. This can vary from light spotting to menstrual-like bleeding. °Follow these instructions at home: °Activity °· Rest for 1-2 days after the procedure. °· Do not douche, use tampons, or have sex for 2 weeks after the procedure, or until your health care provider approves. °· Do not drive for 24 hours after the procedure, or for as long as told by your health care provider. °· Do not drive, use heavy machinery, or drink alcohol while taking prescription pain medicines. °Medicines ° °· Take over-the-counter and prescription medicines only as told by your health care provider. °· Do not take aspirin during recovery. It can increase the risk of bleeding. °General instructions °· Do not take baths, swim, or use a hot tub until your health care provider approves. Take showers instead of baths for 2 weeks, or for as long as told by your health care provider. °· To prevent or treat constipation while you are taking prescription pain medicine, your health care provider may recommend that you: °? Drink enough fluid to keep your urine clear or pale yellow. °? Take over-the-counter or prescription medicines. °? Eat foods that are high in fiber, such as fresh fruits and vegetables, whole grains, and beans. °? Limit foods that are high in fat and processed sugars, such as fried and sweet foods. °· Keep all follow-up visits as told by your health care provider. This is important. °Contact a health care provider if: °· You feel dizzy or lightheaded. °· You feel nauseous. °· You have abnormal vaginal discharge. °· You have a rash. °· You have pain that does not get better with  medicine. °· You have chills. °Get help right away if: °· You have bleeding that is heavier than a normal menstrual period. °· You have a fever. °· You have pain or cramps that get worse. °· You develop new abdominal pain. °· You faint. °· You have pain in your shoulders. °· You have shortness of breath. °Summary °· After the procedure, you may have cramping and some vaginal bleeding. °· Do not douche, use tampons, or have sex for 2 weeks after the procedure, or until your health care provider approves. °· Do not take baths, swim, or use a hot tub until your health care provider approves. Take showers instead of baths for 2 weeks, or for as long as told by your health care provider. °· Report any unusual symptoms to your health care provider. °· Keep all follow-up visits as told by your health care provider. This is important. °This information is not intended to replace advice given to you by your health care provider. Make sure you discuss any questions you have with your health care provider. °Document Released: 06/11/2013 Document Revised: 08/03/2017 Document Reviewed: 09/19/2016 °Elsevier Patient Education © 2020 [employer] ° °AMBULATORY SURGERY  °DISCHARGE INSTRUCTIONS ° ° °1) The drugs that you were given will stay in your system until tomorrow so for the next 24 hours you should not: ° °A) Drive an automobile °B) Make any legal decisions °C) Drink any alcoholic beverage ° ° °2) You may resume regular meals tomorrow.  Today it is better to start with liquids and gradually work   up to solid foods. ° °You may eat anything you prefer, but it is better to start with liquids, then soup and crackers, and gradually work up to solid foods. ° ° °3) Please notify your doctor immediately if you have any unusual bleeding, trouble breathing, redness and pain at the surgery site, drainage, fever, or pain not relieved by medication. ° ° ° °4) Additional Instructions: ° ° ° ° ° ° ° °Please contact your physician with any  problems or Same Day Surgery at [phone], Monday through Friday 6 am to 4 pm, or [facility] [address] number at [phone]. ° °

## 2019-05-30 NOTE — Anesthesia Procedure Notes (Signed)
Procedure Name: LMA Insertion Date/Time: 05/30/2019 2:17 PM Performed by: Lavone Orn, CRNA Pre-anesthesia Checklist: Patient identified, Emergency Drugs available, Suction available, Patient being monitored and Timeout performed Patient Re-evaluated:Patient Re-evaluated prior to induction Oxygen Delivery Method: Circle system utilized Preoxygenation: Pre-oxygenation with 100% oxygen Induction Type: IV induction Ventilation: Mask ventilation without difficulty LMA: LMA inserted LMA Size: 4.0 Number of attempts: 1 Placement Confirmation: positive ETCO2 and breath sounds checked- equal and bilateral Tube secured with: Tape Dental Injury: Teeth and Oropharynx as per pre-operative assessment

## 2019-05-30 NOTE — H&P (Signed)
Preoperative History and Physical  Jean Barrett is a 43 y.o.  With imbedded IUD.  Proposed surgery: hysteroscopy, removal and replacement of IUD  History reviewed. No pertinent past medical history. Past Surgical History:  Procedure Laterality Date  . APPENDECTOMY    . BREAST BIOPSY Right 12/18/2011   Stereotactic biopsy / clip- negative  . CARDIAC ELECTROPHYSIOLOGY STUDY AND ABLATION    . KNEE ARTHROPLASTY Left   . SHOULDER ARTHROSCOPY Right    OB History  No obstetric history on file.  Patient denies any other pertinent gynecologic issues.   No current facility-administered medications on file prior to encounter.    Current Outpatient Medications on File Prior to Encounter  Medication Sig Dispense Refill  . acetaminophen (TYLENOL) 500 MG tablet Take 1,000 mg by mouth every 6 (six) hours as needed for moderate pain or headache.    . Glucosamine-Chondroitin (GLUCOSAMINE CHONDR COMPLEX PO) Take 1 tablet by mouth daily.    . meloxicam (MOBIC) 15 MG tablet Take 15 mg by mouth daily.    . traMADol (ULTRAM) 50 MG tablet Take 50 mg by mouth daily as needed for moderate pain.     No Known Allergies  Social History:   reports that she has quit smoking. She has never used smokeless tobacco. She reports current alcohol use. She reports that she does not use drugs.  Family History  Problem Relation Age of Onset  . Breast cancer Neg Hx     Review of Systems: Noncontributory  PHYSICAL EXAM: Blood pressure 123/75, pulse 78, temperature 97.7 F (36.5 C), temperature source Temporal, resp. rate 18, last menstrual period 05/22/2019. General appearance - alert, well appearing, and in no distress Chest - clear to auscultation, no wheezes, rales or rhonchi, symmetric air entry Heart - normal rate and regular rhythm Abdomen - soft, nontender, nondistended, no masses or organomegaly Pelvic - examination not indicated Extremities - peripheral pulses normal, no pedal edema, no clubbing or  cyanosis  Labs: Results for orders placed or performed during the hospital encounter of 05/30/19 (from the past 336 hour(s))  Pregnancy, urine POC   Collection Time: 05/30/19 10:41 AM  Result Value Ref Range   Preg Test, Ur NEGATIVE NEGATIVE  ABO/Rh   Collection Time: 05/30/19 10:41 AM  Result Value Ref Range   ABO/RH(D)      A POS Performed at [facility], [address] [place], [place] [address]   Results for orders placed or performed during the hospital encounter of 05/27/19 (from the past 336 hour(s))  SARS CORONAVIRUS 2 (TAT 6-24 HRS) Nasopharyngeal Nasopharyngeal Swab   Collection Time: 05/27/19  8:08 AM   Specimen: Nasopharyngeal Swab  Result Value Ref Range   SARS Coronavirus 2 NEGATIVE NEGATIVE  Basic metabolic panel   Collection Time: 05/27/19  8:08 AM  Result Value Ref Range   Sodium 140 135 - 145 mmol/L   Potassium 3.9 3.5 - 5.1 mmol/L   Chloride 106 98 - 111 mmol/L   CO2 24 22 - 32 mmol/L   Glucose, Bld 90 70 - 99 mg/dL   BUN 18 6 - 20 mg/dL   Creatinine, Ser 0.61 0.44 - 1.00 mg/dL   Calcium 9.0 8.9 - 10.3 mg/dL   GFR calc non Af Amer >60 >60 mL/min   GFR calc Af Amer >60 >60 mL/min   Anion gap 10 5 - 15  CBC   Collection Time: 05/27/19  8:08 AM  Result Value Ref Range   WBC 4.3 4.0 -  10.5 K/uL   RBC 5.04 3.87 - 5.11 MIL/uL   Hemoglobin 15.5 (H) 12.0 - 15.0 g/dL   HCT 62.3 76.2 - 83.1 %   MCV 90.1 80.0 - 100.0 fL   MCH 30.8 26.0 - 34.0 pg   MCHC 34.1 30.0 - 36.0 g/dL   RDW 51.7 61.6 - 07.3 %   Platelets 198 150 - 400 K/uL   nRBC 0.0 0.0 - 0.2 %  Type and screen [facility]   Collection Time: 05/27/19  8:08 AM  Result Value Ref Range   ABO/RH(D) A POS    Antibody Screen NEG    Sample Expiration 06/10/2019,2359    Extend sample reason      NO TRANSFUSIONS OR PREGNANCY IN THE PAST 3 MONTHS Performed at [facility], [address], [place], [place] [address]     Imaging Studies: No results  found.  Assessment: Imbedded IUD  Plan: Patient will undergo surgical management with removal and replacement of IUD, hysteroscopy.   The risks of surgery were discussed in detail with the patient including but not limited to: bleeding which may require transfusion or reoperation; infection which may require antibiotics; injury to surrounding organs which may involve bowel, bladder, ureters ; need for additional procedures including laparoscopy or laparotomy; thromboembolic phenomenon, surgical site problems and other postoperative/anesthesia complications. Likelihood of success in alleviating the patient's condition was discussed. Routine postoperative instructions will be reviewed with the patient and her family in detail after surgery.  The patient concurred with the proposed plan, giving informed written consent for the surgery.  Patient has been NPO since last night she will remain NPO for procedure.  Anesthesia and OR aware.  Preoperative prophylactic antibiotics and SCDs ordered on call to the OR.  To OR when ready.  ----- Ranae Plumber, MD, FACOG Attending Obstetrician and Gynecologist [facility], Department of OB/GYN [facility]

## 2019-05-30 NOTE — Anesthesia Post-op Follow-up Note (Signed)
Anesthesia QCDR form completed.        

## 2019-06-02 ENCOUNTER — Encounter: Payer: Self-pay | Admitting: Obstetrics & Gynecology

## 2019-06-03 ENCOUNTER — Encounter: Payer: Self-pay | Admitting: Obstetrics & Gynecology

## 2019-06-04 NOTE — Op Note (Addendum)
Operative Report Hysteroscopy, Removal/Insertion of IUD 05/30/2019  Patient:  Jean Barrett  43 y.o. female Preoperative diagnosis:  Imbedded IUD Postoperative diagnosis:  Imbedded IUD, insertion of IUD for pregnancy prevention.  PROCEDURE:  Procedure(s): HYSTEROSCOPY (N/A) INTRAUTERINE DEVICE (IUD) INSERTION Mirena (N/A) INTRAUTERINE DEVICE (IUD) REMOVAL Mirena (N/A) Surgeon:  Surgeon(s) and Role:    * Ward, Honor Loh, MD - Primary Anesthesia:  LMA I/O: see flowsheet Specimens:  none Complications: None Apparent Disposition:  VS stable to PACU  Findings: Uterus, mobile, normal size, normal cervix, vagina, perineum.  On hysteroscopy, IUD seen in fundus / anterior uterus   Indication for procedure/Consents: 43 y.o. here for scheduled surgery.  She had attempted IUD removal in the office and it was unable to be removed under ultrasound. Risks of surgery were discussed with the patient including but not limited to: bleeding which may require transfusion; infection which may require antibiotics; injury to uterus or surrounding organs; intrauterine scarring which may impair future fertility; need for additional procedures including laparotomy or laparoscopy; and other postoperative/anesthesia complications. Written informed consent was obtained.    Procedure Details:   The patient was then taken to the operating room where anesthesia was administered and was found to be adequate.  After a formal timeout was performed, she was placed in the dorsal lithotomy position and examined with the above findings. She was then prepped and draped in the sterile manner.  A speculum was then placed in the patient's vagina and a single tooth tenaculum was applied to the anterior lip of the cervix.   Her cervix was serially dilated to accommodate the myoscope, with findings as above. The base of the IUD was seen with the camera, and an attempt to grasp with the hysteroscopic grasper was made, however that  instrument was malfunctioning.  A thin polyp forceps was inserted into the uterus, and the IUD was grasped, and without much traction was removed intact.  The new Mirena IUD was then inserted with confirmation at the fundus, and deployed in typical fashion. Strings were cut to 3cm. The tenaculum was removed from the anterior lip of the cervix and the vaginal speculum was removed after noting good hemostasis. The patient tolerated the procedure well and was taken to the recovery area awake, extubated and in stable condition.  The patient will be discharged to home as per PACU criteria.  Routine postoperative instructions given. She will follow up in the clinic in four weeks for postoperative evaluation.  Larey Days, MD [facility] OBGYN Attending Gynecologist

## 2020-04-05 ENCOUNTER — Other Ambulatory Visit: Payer: Self-pay | Admitting: Obstetrics & Gynecology

## 2020-04-05 DIAGNOSIS — Z1231 Encounter for screening mammogram for malignant neoplasm of breast: Secondary | ICD-10-CM

## 2020-04-20 ENCOUNTER — Other Ambulatory Visit: Payer: Self-pay

## 2020-04-20 ENCOUNTER — Ambulatory Visit
Admission: RE | Admit: 2020-04-20 | Discharge: 2020-04-20 | Disposition: A | Discharge status: Home / Self Care | Payer: BC Managed Care – PPO | Source: Ambulatory Visit | Attending: Obstetrics & Gynecology | Admitting: Obstetrics & Gynecology

## 2020-04-20 DIAGNOSIS — Z1231 Encounter for screening mammogram for malignant neoplasm of breast: Secondary | ICD-10-CM | POA: Diagnosis not present

## 2020-12-01 ENCOUNTER — Other Ambulatory Visit: Payer: Self-pay | Admitting: Orthopedic Surgery

## 2020-12-01 DIAGNOSIS — M2391 Unspecified internal derangement of right knee: Secondary | ICD-10-CM

## 2020-12-01 DIAGNOSIS — M1711 Unilateral primary osteoarthritis, right knee: Secondary | ICD-10-CM

## 2020-12-01 DIAGNOSIS — M25561 Pain in right knee: Secondary | ICD-10-CM

## 2020-12-15 ENCOUNTER — Ambulatory Visit
Admission: RE | Admit: 2020-12-15 | Discharge: 2020-12-15 | Disposition: A | Discharge status: Home / Self Care | Payer: BC Managed Care – PPO | Source: Ambulatory Visit | Attending: Orthopedic Surgery | Admitting: Orthopedic Surgery

## 2020-12-15 ENCOUNTER — Other Ambulatory Visit: Payer: Self-pay

## 2020-12-15 DIAGNOSIS — M25561 Pain in right knee: Secondary | ICD-10-CM | POA: Insufficient documentation

## 2020-12-15 DIAGNOSIS — M2391 Unspecified internal derangement of right knee: Secondary | ICD-10-CM | POA: Diagnosis present

## 2020-12-15 DIAGNOSIS — M1711 Unilateral primary osteoarthritis, right knee: Secondary | ICD-10-CM | POA: Insufficient documentation

## 2022-11-07 ENCOUNTER — Other Ambulatory Visit: Payer: Self-pay | Admitting: Obstetrics

## 2022-11-07 DIAGNOSIS — Z1231 Encounter for screening mammogram for malignant neoplasm of breast: Secondary | ICD-10-CM

## 2022-11-15 ENCOUNTER — Ambulatory Visit
Admission: RE | Admit: 2022-11-15 | Discharge: 2022-11-15 | Disposition: A | Discharge status: Home / Self Care | Payer: BC Managed Care – PPO | Source: Ambulatory Visit | Attending: Obstetrics | Admitting: Obstetrics

## 2022-11-15 DIAGNOSIS — Z1231 Encounter for screening mammogram for malignant neoplasm of breast: Secondary | ICD-10-CM | POA: Diagnosis present
